# Patient Record
Sex: Male | Born: 1975
Health system: Southern US, Community
[De-identification: ages and names within clinical notes are randomized; demographics above are authoritative.]

---

## 2020-01-13 ENCOUNTER — Other Ambulatory Visit: Payer: Self-pay

## 2020-01-13 ENCOUNTER — Encounter: Payer: Self-pay | Admitting: Family Medicine

## 2020-01-13 ENCOUNTER — Ambulatory Visit (INDEPENDENT_AMBULATORY_CARE_PROVIDER_SITE_OTHER): Payer: Self-pay | Admitting: Family Medicine

## 2020-01-13 ENCOUNTER — Ambulatory Visit: Payer: Self-pay

## 2020-01-13 VITALS — BP 120/80 | HR 110 | Ht 70.0 in | Wt 193.0 lb

## 2020-01-13 DIAGNOSIS — M25512 Pain in left shoulder: Secondary | ICD-10-CM

## 2020-01-13 DIAGNOSIS — G8929 Other chronic pain: Secondary | ICD-10-CM

## 2020-01-13 NOTE — Patient Instructions (Addendum)
Thank you for coming in today. Do the exercises we discussed.  Try voltaren gel up to 4x daily.  We will consider nitorpatches.  Keep me updated.  Recheck as needed.   Look up shoulder impingement.  Long head of biceps tendonitis.  Distal clavicle osteolysis Scapula Stabilization.   Please perform the exercise program that we have prepared for you and gone over in detail on a daily basis.  In addition to the handout you were provided you can access your program through: [email or web address]   Your unique program code is: [email or web address]   Nitroglycerin Protocol   Apply 1/4 nitroglycerin patch to affected area daily.  Change position of patch within the affected area every 24 hours.  You may experience a headache during the first 1-2 weeks of using the patch, these should subside.  If you experience headaches after beginning nitroglycerin patch treatment, you may take your preferred over the counter pain reliever.  Another side effect of the nitroglycerin patch is skin irritation or rash related to patch adhesive.  Please notify our office if you develop more severe headaches or rash, and stop the patch.  Tendon healing with nitroglycerin patch may require 12 to 24 weeks depending on the extent of injury.  Men should not use if taking Viagra, Cialis, or Levitra.   Do not use if you have migraines or rosacea.

## 2020-01-13 NOTE — Progress Notes (Signed)
Subjective:    CC: L shoulder pain  I, Molly Weber, LAT, ATC, am serving as scribe for Dr. Clementeen Graham.  HPI: Pt is a 44 y/o male presenting w/ c/o L shoulder pain x years w/ no specific MOI.  He likes to workout at the gym and do heavy bench and overhead press.  He locates his pain to his anterior shoulder.  He rates his pain as moderate and describes his pain as sharp.  He participates in CrossFit.  He finds exercise and weightlifting very helpful and does not want to stop it if able.  Radiating pain: yes into the L upper arm L shoulder mechanical symptoms: Yes Aggravating factors: reach and turn; Arnold overhead press; L sidelying; functional IR Treatments tried: Theragun; accupuncture in the past; PT  Pertinent review of Systems: No fevers or chills  Relevant historical information: No significant medical history available today.  Patient recently lived in [place].   Objective:    Vitals:   01/13/20 0813  BP: 120/80  Pulse: (!) 110  SpO2: 95%   General: Well Developed, well nourished, and in no acute distress.   MSK: C-spine: Normal-appearing nontender normal cervical motion. Left shoulder normal-appearing without obvious abnormality. Mildly tender palpation AC joint. Nontender anterior shoulder at bicipital groove. Range of motion: Abduction full.  Forward flexion full however some pain beyond 90 degrees. Internal rotation to lumbar spine. Normal external rotation. Strength intact abduction external/internal rotation. Mildly positive empty can test. Mildly positive Hawkins and Neer's test. Negative Yergason's and speeds test. Positive crossover arm compression test. Positive O'Brien test.   Negative click or clunk or relocation test.  Contralateral right shoulder normal-appearing Nontender. Normal motion. Normal strength. Negative impingement biceps and labrum testing.  Pulses cap refill and sensation are intact distally.  Lab and Radiology  Results  Diagnostic Limited MSK Ultrasound of: Left shoulder Biceps tendon is intact however does not in the bicipital groove.  Trace hypoechoic fluid surrounding tendon and tendon sheath. Subscapularis tendon is intact without obvious tear. Supraspinatus tendon intact without obvious tear. Infraspinatus tendon intact without obvious tear. AC joint slightly degenerative appearing with mild effusion. Impression: Evidence of proximal biceps tendon subluxation.  No significant rotator cuff tear visible.    Impression and Recommendations:    Assessment and Plan: 44 y.o. male with left shoulder pain.  Ongoing off and on for months.  Triggering activity usually involves abduction and crossover arm positioning.  I think he probably has a component of pain originating from [facility] joint probably distal clavicle osteolysis.  Additionally he probably is having some shoulder impingement as well. Discussed options.  ATC reviewed home exercise program and scapular stabilization protocol with patient. Additionally discussed strategies for weight lifting while minimizing pain. Also recommend Voltaren gel especially at [facility] joint.  If not improving would recommend starting nitroglycerin patch protocol and considering formal physical therapy referral.  He is briefly without health insurance so physical therapy right now is probably can to be a bit expensive.  Recheck back as needed.  Precautions reviewed.  Patient expresses understanding and agreement.   Orders Placed This Encounter  Procedures  . [place] LIMITED JOINT SPACE STRUCTURES UP LEFT(NO LINKED CHARGES)    Order Specific Question:   Reason for Exam (SYMPTOM  OR DIAGNOSIS REQUIRED)    Answer:   L shoulder pain    Order Specific Question:   Preferred imaging location?    Answer:   [facility]   No orders of the defined  types were placed in this encounter.   Discussed warning signs or symptoms. Please see discharge instructions.  Patient expresses understanding.   The above documentation has been reviewed and is accurate and complete Lynne Leader

## 2020-06-23 DIAGNOSIS — F4320 Adjustment disorder, unspecified: Secondary | ICD-10-CM | POA: Diagnosis not present

## 2020-07-07 DIAGNOSIS — F4320 Adjustment disorder, unspecified: Secondary | ICD-10-CM | POA: Diagnosis not present

## 2021-02-24 NOTE — Progress Notes (Signed)
I, Christoper Fabian, LAT, ATC, am serving as scribe for Dr. Clementeen Graham.  Reginald Hester is a 45 y.o. male who presents to [facility] today for chronic R shoulder pain x 10 years that has been getting progressively worse.  He states that his R shoulder "went out" when he was bowling following doing resisted bench w/ resistance bands.   He notes that his wife had to pull his arm to get his R shoulder back in place later that night after finishing bowling.  Pt was previously seen by Dr. Denyse Amass on 01/13/20 for chronic L shoulder pain. Pt likes to workout/weightlift and participates in CrossFit. Pt locates pain to his R sup/ant shoulder.  He has been going to physical therapy for this issue for 8 weeks.  He has been doing home shoulder stabilization exercises and receiving dry needling with only minimal benefit.  He notes symptoms started about 8 weeks ago when he was bowling and he felt his shoulder effectively dislocate.  He looked it up and his wife helped him reduce his own shoulder dislocation by pulling axially on his arm until the shoulder went into a more normal and comfortable position.  Radiating pain: No  R shoulder Mechanical symptoms: yes UE Numbness/tingling: No Aggravates: unloaded forward reach; pushing I.e. push-ups and bench press Treatments tried: dry needling; Body Blade; relative rest;   Pertinent review of systems: No fevers or chills  Relevant historical information: Otherwise healthy   Exam:  BP (!) 160/88 (BP Location: Right Arm, Patient Position: Sitting, Cuff Size: Normal)   Pulse (!) 104   Ht 5\' 10"  (1.778 m)   Wt 194 lb 9.6 oz (88.3 kg)   SpO2 97%   BMI 27.92 kg/m  General: Well Developed, well nourished, and in no acute distress.   MSK: Right shoulder well-developed musculature normal-appearing otherwise. Normal shoulder motion pain with abduction. Strength 3/5 to abduction 4/5 external rotation 5/5 internal rotation. Positive  Hawkins and Neer's test positive empty can test positive O'Brien's test. Positive clunk and relocation test.  Negative anterior apprehension test.    Lab and Radiology Results  X-ray images right shoulder obtained today personally and independently interpreted Narrowed AC joint.  No acute fractures. Await formal radiology review  Diagnostic Limited MSK Ultrasound of: Right shoulder Biceps tendon intact.  Small amount of hypoechoic fluid tracks within biceps tendon sheath. Subscapularis tendon is then.  Hypoechoic fluid tracks superficial to subscapularis tendon.  Possible partial thickness tear present without significant retraction. Footplate at subscapularis insertion is irregular Supraspinatus tendon is intact. Mild subacromial bursitis present. Infraspinatus tendon is intact. AC joint mild effusion Impression: Concern subscapularis tear    Assessment and Plan: 45 y.o. male with right shoulder pain.  Patient describes a long history of repeated shoulder pain and dysfunction and what sounds like dislocations.  He had a shoulder dislocation event about 10 weeks ago where his wife successfully reduced a shoulder dislocation herself.  He has been managing this with physical therapy for the last 8 weeks with some improvement but not enough improvement.  He has persistent pain and dysfunction and profound weakness on exam today.  When asked he says that he would to have surgery for the shoulder if needed.  Plan to proceed with MRI arthrogram to further evaluate cause of shoulder pain and dysfunction including potential labrum and rotator cuff tear.  Recheck after MRI.   PDMP not reviewed this encounter. Orders Placed This Encounter  Procedures  . 59  LIMITED JOINT SPACE STRUCTURES UP RIGHT(NO LINKED CHARGES)    Order Specific Question:   Reason for Exam (SYMPTOM  OR DIAGNOSIS REQUIRED)    Answer:   R shoulder pain    Order Specific Question:   Preferred imaging location?    Answer:    [facility]  . DG Shoulder Right    Standing Status:   Future    Number of Occurrences:   1    Standing Expiration Date:   02/27/2022    Order Specific Question:   Reason for Exam (SYMPTOM  OR DIAGNOSIS REQUIRED)    Answer:   eval shoulder pain    Order Specific Question:   Preferred imaging location?    Answer:   [facility]  . MR Shoulder Right W Contrast    No iv contrast. Arthrogam only. Dr T 1 hr prior to injection    Standing Status:   Future    Standing Expiration Date:   02/27/2022    Scheduling Instructions:     No iv contrast. Arthrogam only. Dr T 1 hr prior to injection    Order Specific Question:   If indicated for the ordered procedure, I authorize the administration of contrast media per Radiology protocol    Answer:   Yes    Order Specific Question:   What is the patient's sedation requirement?    Answer:   No Sedation    Order Specific Question:   Does the patient have a pacemaker or implanted devices?    Answer:   No    Order Specific Question:   Preferred imaging location?    Answer:   [facility] (table limit-350lbs)   No orders of the defined types were placed in this encounter.    Discussed warning signs or symptoms. Please see discharge instructions. Patient expresses understanding.   The above documentation has been reviewed and is accurate and complete Clementeen Graham, M.D.

## 2021-02-27 ENCOUNTER — Ambulatory Visit (INDEPENDENT_AMBULATORY_CARE_PROVIDER_SITE_OTHER): Payer: BC Managed Care – PPO | Admitting: Family Medicine

## 2021-02-27 ENCOUNTER — Ambulatory Visit (INDEPENDENT_AMBULATORY_CARE_PROVIDER_SITE_OTHER): Payer: BC Managed Care – PPO

## 2021-02-27 ENCOUNTER — Ambulatory Visit: Payer: Self-pay

## 2021-02-27 ENCOUNTER — Encounter: Payer: Self-pay | Admitting: Family Medicine

## 2021-02-27 ENCOUNTER — Other Ambulatory Visit: Payer: Self-pay

## 2021-02-27 VITALS — BP 160/88 | HR 104 | Ht 70.0 in | Wt 194.6 lb

## 2021-02-27 DIAGNOSIS — G8929 Other chronic pain: Secondary | ICD-10-CM

## 2021-02-27 DIAGNOSIS — M25511 Pain in right shoulder: Secondary | ICD-10-CM

## 2021-02-27 NOTE — Patient Instructions (Addendum)
Thank you for coming in today.   You should hear from MRI scheduling within 1 week. If you do not hear please let me know.    SLAP Lesions Rehab Ask your health care provider which exercises are safe for you. Do exercises exactly as told by your health care provider and adjust them as directed. It is normal to feel mild stretching, pulling, tightness, or discomfort as you do these exercises. Stop right away if you feel sudden pain or your pain gets worse. Do not begin these exercises until told by your health care provider. Stretching and range-of-motion exercise This exercise warms up your muscles and joints and improves the movement and flexibility of your shoulder. This exercise also helps to relieve pain and stiffness. Passive shoulder horizontal adduction In passive adduction, you use your other hand to move the injured arm toward your body. The injured arm does not move on its own (passive). In this movement, your arm is moved across your body in the horizontal plane (horizontal adduction). 1. Sit or stand and pull your left / right elbow across your chest, toward your other shoulder. Stop when you feel a gentle stretch in the back of your shoulder and upper arm. ? Keep your arm at shoulder height. ? Keep your arm as close to your body as you comfortably can. 2. Hold for __________ seconds. 3. Slowly return to the starting position. Repeat __________ times. Complete this exercise __________ times a day.   Strengthening exercises These exercises build strength and endurance in your shoulder. Endurance is the ability to use your muscles for a long time, even after they get tired. Scapular protraction, supine 1. Lie on your back on a firm surface (supine position). Hold a __________ lb / kg weight in your left / right hand. 2. Raise your left / right arm straight into the air so your hand is directly above your shoulder joint. 3. Push the weight into the air so your shoulder (scapula) lifts  off the surface that you are lying on. The scapula will push up or forward (protraction). Do not move your head, neck, or back. 4. Hold for __________ seconds. 5. Slowly return to the starting position. Let your muscles relax completely before you repeat this exercise. Repeat __________ times. Complete this exercise __________ times a day.   Scapular retraction 1. Sit in a stable chair without armrests, or stand up. 2. Secure an exercise band to a stable object in front of you so the band is at shoulder height. 3. Hold one end of the exercise band in each hand. Your palms should face down. 4. Squeeze your shoulder blades (scapulae) together and move your elbows slightly behind you (retraction). Do not shrug your shoulders. 5. Hold for __________ seconds. 6. Slowly return to the starting position. Repeat __________ times. Complete this exercise __________ times a day.   Shoulder external rotation 1. Lie down on your uninjured side. Place a soft object, such as a small folded towel, between your left / right arm (your top arm) and your body. 2. Bend your left / right elbow to a 90-degree angle (right angle). Place your left / right hand palm-down on your abdomen. Squeeze your shoulder blade back. 3. Keeping your elbow bent to a 90-degree angle, move your left / right forearm away from your abdomen (external rotation). ? Your upper arm should not move off the folded towel. ? Keep your shoulder blade back. 4. Hold for __________ seconds. 5. Slowly return to the  starting position. Repeat __________ times. Complete this exercise __________ times a day. Shoulder extension, prone 1. Lie on your abdomen (prone position) on a firm surface so your left / right arm hangs over the edge. 2. Hold a __________ lb / kg weight in your hand so your palm faces in toward your body. Your arm should be straight. 3. Squeeze your shoulder blade down toward the middle of your back. 4. Slowly raise your arm behind  you, up to the height of the surface that you are lying on (extension). Keep your arm straight. 5. Hold for __________ seconds. 6. Slowly return to the starting position and relax your muscles. Repeat __________ times. Complete this exercise __________ times a day.   This information is not intended to replace advice given to you by your health care provider. Make sure you discuss any questions you have with your health care provider. Document Revised: 01/06/2019 Document Reviewed: 01/06/2019 Elsevier Patient Education  2021 [employer].

## 2021-02-28 NOTE — Progress Notes (Signed)
Right shoulder x-ray does show some mild arthritis.  MRI will be helpful

## 2021-03-01 ENCOUNTER — Telehealth: Payer: Self-pay | Admitting: Radiology

## 2021-03-01 ENCOUNTER — Other Ambulatory Visit: Payer: Self-pay | Admitting: Family Medicine

## 2021-03-01 DIAGNOSIS — Z1389 Encounter for screening for other disorder: Secondary | ICD-10-CM

## 2021-03-01 MED ORDER — LORAZEPAM 0.5 MG PO TABS: ORAL_TABLET | ORAL | 0 refills | Status: AC

## 2021-03-01 NOTE — Telephone Encounter (Signed)
Ativan prescribed for MRI.

## 2021-03-01 NOTE — Telephone Encounter (Signed)
Spoke w/ pt and let him know the rx was sent into his pharmacy. Pt verbalized understanding.

## 2021-03-06 ENCOUNTER — Ambulatory Visit (INDEPENDENT_AMBULATORY_CARE_PROVIDER_SITE_OTHER): Payer: BC Managed Care – PPO | Admitting: Sports Medicine

## 2021-03-06 ENCOUNTER — Other Ambulatory Visit: Payer: Self-pay

## 2021-03-06 ENCOUNTER — Ambulatory Visit (INDEPENDENT_AMBULATORY_CARE_PROVIDER_SITE_OTHER): Payer: BC Managed Care – PPO

## 2021-03-06 DIAGNOSIS — M19011 Primary osteoarthritis, right shoulder: Secondary | ICD-10-CM | POA: Diagnosis not present

## 2021-03-06 DIAGNOSIS — G8929 Other chronic pain: Secondary | ICD-10-CM | POA: Diagnosis not present

## 2021-03-06 DIAGNOSIS — M25511 Pain in right shoulder: Secondary | ICD-10-CM | POA: Diagnosis not present

## 2021-03-06 DIAGNOSIS — M24411 Recurrent dislocation, right shoulder: Secondary | ICD-10-CM

## 2021-03-06 DIAGNOSIS — Z1389 Encounter for screening for other disorder: Secondary | ICD-10-CM | POA: Diagnosis not present

## 2021-03-06 DIAGNOSIS — Z01818 Encounter for other preprocedural examination: Secondary | ICD-10-CM | POA: Diagnosis not present

## 2021-03-06 DIAGNOSIS — M75121 Complete rotator cuff tear or rupture of right shoulder, not specified as traumatic: Secondary | ICD-10-CM | POA: Diagnosis not present

## 2021-03-06 DIAGNOSIS — S43431A Superior glenoid labrum lesion of right shoulder, initial encounter: Secondary | ICD-10-CM | POA: Diagnosis not present

## 2021-03-06 NOTE — Assessment & Plan Note (Signed)
Celester has had several potential shoulder dislocations, failed therapy for 8 weeks, MR arthrography, further management per primary treating provider.

## 2021-03-06 NOTE — Progress Notes (Signed)
    Procedures performed today:    Procedure: Real-time Ultrasound Guided gadolinium contrast injection of right glenohumeral joint Device: Samsung HS60  Verbal informed consent obtained.  Time-out conducted.  Noted no overlying erythema, induration, or other signs of local infection.  Skin prepped in a sterile fashion.  Local anesthesia: Topical Ethyl chloride.  With sterile technique and under real time ultrasound guidance: Noted normal-appearing joint.  I advanced a 22-gauge spinal needle into the glenohumeral joint from a posterior approach, I injected 1 cc kenalog 40, 2 cc lidocaine, 2 cc bupivacaine, syringe switched and 0.1 cc gadolinium injected, syringe again switched and 10 cc sterile saline used to distend the joint. Joint visualized and capsule seen distending confirming intra-articular placement of contrast material and medication. Completed without difficulty with exception of a mild vagal episode. Advised to call if fevers/chills, erythema, induration, drainage, or persistent bleeding.  Images permanently stored in PACS Impression: Technically successful ultrasound guided gadolinium contrast injection for MR arthrography.  Please see separate MR arthrogram report.  Independent interpretation of notes and tests performed by another provider:   None.  Brief History, Exam, Impression, and Recommendations:    Shoulder dislocation, recurrent, right Olander has had several potential shoulder dislocations, failed therapy for 8 weeks, MR arthrography, further management per primary treating provider.    ___________________________________________ Ihor Austin. Benjamin Stain, M.D., ABFM., CAQSM. Primary Care and Sports Medicine  [facility]  Adjunct Instructor of Family Medicine  [employer]

## 2021-03-08 ENCOUNTER — Telehealth: Payer: Self-pay | Admitting: Family Medicine

## 2021-03-08 DIAGNOSIS — G8929 Other chronic pain: Secondary | ICD-10-CM

## 2021-03-08 DIAGNOSIS — M75111 Incomplete rotator cuff tear or rupture of right shoulder, not specified as traumatic: Secondary | ICD-10-CM

## 2021-03-08 NOTE — Telephone Encounter (Signed)
I called Reginald Hester back.  Recommend that he follow-up with orthopedic surgeon sent to have detailed discussion about surgical options and whether or not it can wait.  I think it probably could wait till September but he should not wait until September to have the surgical consultation.

## 2021-03-08 NOTE — Progress Notes (Signed)
MRI shows rotator cuff tear of the supraspinatus and subscapularis rotator cuff tendons.  Additionally there is some tearing of the labrum.  This will require surgery.  Have already placed referral to orthopedic surgery.  You should hear soon about an appointment.

## 2021-03-08 NOTE — Telephone Encounter (Signed)
Pt aware of MRI results and surgical referral, he has 3 follow up questions:  He has 3 trips planned for this summer, he would like to wait until September for surgery and wonders what Dr. Denyse Amass would recommend.   Should he continue working out?  Can the MRI tell if the tear is new or old?

## 2021-03-08 NOTE — Telephone Encounter (Signed)
Referral to orthopedics placed today

## 2021-03-15 DIAGNOSIS — M25511 Pain in right shoulder: Secondary | ICD-10-CM | POA: Diagnosis not present

## 2021-03-30 ENCOUNTER — Encounter: Payer: Self-pay | Admitting: Orthopedic Surgery

## 2021-03-30 ENCOUNTER — Other Ambulatory Visit: Payer: Self-pay

## 2021-03-30 ENCOUNTER — Ambulatory Visit (INDEPENDENT_AMBULATORY_CARE_PROVIDER_SITE_OTHER): Payer: BC Managed Care – PPO | Admitting: Orthopedic Surgery

## 2021-03-30 DIAGNOSIS — M75121 Complete rotator cuff tear or rupture of right shoulder, not specified as traumatic: Secondary | ICD-10-CM

## 2021-03-30 NOTE — Progress Notes (Signed)
Office Visit Note   Patient: Reginald Hester           Date of Birth: 1976-07-21           MRN: [id number] Visit Date: 03/30/2021 Requested by: Rodolph Bong, MD [address] [place],  [place] [address] PCP: Patient, No Pcp Per (Inactive)  Subjective: Chief Complaint  Patient presents with   Right Shoulder - Pain    HPI: Reginald Hester is a 45 year old patient with right shoulder pain.  States he has had pain in the shoulder for multiple years.  Had first dislocation episode about 10 years ago and 5 since.  His wife is always been able to reduce the shoulder.  He is retired.  He was doing bench press about 2-1/2 to 3 weeks ago and felt an injury but it has not recovered from that injury.  He has been doing some dry needling and has had therapy with Eulis Foster since his MRI scan.  MRI scan shows retracted tear of the supraspinatus to the glenoid and retracted tear of the subscapularis to the glenoid.  Biceps tendon is also out of the groove.  He likes to do weightlifting.  Would like to to a triathlon before age 30 but he has not been swimming since his shoulder has been bothering him.              ROS: All systems reviewed are negative as they relate to the chief complaint within the history of present illness.  Patient denies  fevers or chills.   Assessment & Plan: Visit Diagnoses:  1. Complete tear of right rotator cuff, unspecified whether traumatic     Plan: Impression is unrepairable tear of the subscapularis and likely unrepairable tear of the supraspinatus with retraction to the glenoid rim.  Discussed with Coben that I have had only about 20% success in mobilizing the tendon enough to achieve a repair.  He is surprisingly functional with his right arm indicating that the stairs are likely of long duration mild atrophy of the supraspinatus and subscap.  Discussed with him operative and nonoperative treatment options.  In general instability would be the deciding factor for operative  treatment in the shoulder.  He would require latissimus transfer which would be a rather imperfect solution for his problem.  Nonetheless he is too young to consider any type of shoulder replacement.  He is very functional at this time and I think would be able to manage avoiding overhead lifting and positions of dislocation in the future.  He is going to do that and then we will see him back in 6 months.  Follow-Up Instructions: Return in about 6 months (around 09/30/2021).   Orders:  No orders of the defined types were placed in this encounter.  No orders of the defined types were placed in this encounter.     Procedures: No procedures performed   Clinical Data: No additional findings.  Objective: Vital Signs: There were no vitals taken for this visit.  Physical Exam:   Constitutional: Patient appears well-developed HEENT:  Head: Normocephalic Eyes:EOM are normal Neck: Normal range of motion Cardiovascular: Normal rate Pulmonary/chest: Effort normal Neurologic: Patient is alert Skin: Skin is warm Psychiatric: Patient has normal mood and affect   Ortho Exam: Ortho exam of the right demonstrates passive motion of 75 compared to 45 on the left/100/170.  He has excellent infraspinatus strength as well as no discernible deficit to supraspinatus strength testing.  Subscap is weak on belly press  test compared to the left-hand side.  Patient has very well-developed musculature around the shoulder girdle region.  Specialty Comments:  No specialty comments available.  Imaging: No results found.   PMFS History: Patient Active Problem List   Diagnosis Date Noted   Shoulder dislocation, recurrent, right 03/06/2021   History reviewed. No pertinent past medical history.  History reviewed. No pertinent family history.  History reviewed. No pertinent surgical history. Social History   Occupational History   Not on file  Tobacco Use   Smoking status: Never   Smokeless tobacco:  Never  Substance and Sexual Activity   Alcohol use: Not on file   Drug use: Not on file   Sexual activity: Not on file

## 2021-04-11 ENCOUNTER — Other Ambulatory Visit: Payer: Self-pay | Admitting: Orthopaedic Surgery

## 2021-04-11 DIAGNOSIS — M25511 Pain in right shoulder: Secondary | ICD-10-CM

## 2021-05-01 ENCOUNTER — Ambulatory Visit
Admission: RE | Admit: 2021-05-01 | Discharge: 2021-05-01 | Disposition: A | Discharge status: Home / Self Care | Payer: BC Managed Care – PPO | Source: Ambulatory Visit | Attending: Orthopaedic Surgery | Admitting: Orthopaedic Surgery

## 2021-05-01 ENCOUNTER — Other Ambulatory Visit: Payer: Self-pay

## 2021-05-01 DIAGNOSIS — M75101 Unspecified rotator cuff tear or rupture of right shoulder, not specified as traumatic: Secondary | ICD-10-CM | POA: Diagnosis not present

## 2021-05-01 DIAGNOSIS — M19011 Primary osteoarthritis, right shoulder: Secondary | ICD-10-CM | POA: Diagnosis not present

## 2021-05-01 DIAGNOSIS — M25511 Pain in right shoulder: Secondary | ICD-10-CM

## 2021-05-01 DIAGNOSIS — M62511 Muscle wasting and atrophy, not elsewhere classified, right shoulder: Secondary | ICD-10-CM | POA: Diagnosis not present

## 2021-05-09 DIAGNOSIS — M25511 Pain in right shoulder: Secondary | ICD-10-CM | POA: Diagnosis not present

## 2021-09-04 DIAGNOSIS — N529 Male erectile dysfunction, unspecified: Secondary | ICD-10-CM | POA: Diagnosis not present

## 2021-09-04 DIAGNOSIS — E237 Disorder of pituitary gland, unspecified: Secondary | ICD-10-CM | POA: Diagnosis not present

## 2021-09-04 DIAGNOSIS — E349 Endocrine disorder, unspecified: Secondary | ICD-10-CM | POA: Diagnosis not present

## 2021-09-04 DIAGNOSIS — M791 Myalgia, unspecified site: Secondary | ICD-10-CM | POA: Diagnosis not present

## 2021-09-11 DIAGNOSIS — F419 Anxiety disorder, unspecified: Secondary | ICD-10-CM | POA: Diagnosis not present

## 2021-09-12 DIAGNOSIS — Z79891 Long term (current) use of opiate analgesic: Secondary | ICD-10-CM | POA: Diagnosis not present

## 2021-11-07 DIAGNOSIS — M25511 Pain in right shoulder: Secondary | ICD-10-CM | POA: Diagnosis not present

## 2021-11-13 DIAGNOSIS — M25511 Pain in right shoulder: Secondary | ICD-10-CM | POA: Diagnosis not present

## 2022-02-27 DIAGNOSIS — N529 Male erectile dysfunction, unspecified: Secondary | ICD-10-CM | POA: Diagnosis not present

## 2022-02-27 DIAGNOSIS — E237 Disorder of pituitary gland, unspecified: Secondary | ICD-10-CM | POA: Diagnosis not present

## 2022-02-27 DIAGNOSIS — E349 Endocrine disorder, unspecified: Secondary | ICD-10-CM | POA: Diagnosis not present

## 2022-02-27 DIAGNOSIS — M791 Myalgia, unspecified site: Secondary | ICD-10-CM | POA: Diagnosis not present

## 2023-05-11 IMAGING — MR MR SHOULDER*R* W/CM
6 of 8 series · 26 of 40 positions shown · IV contrast (agent unspecified)
Comparison: None.

CLINICAL DATA: Shoulder pain, labral tear suspected, nondiagnostic
xray

EXAM:
MR ARTHROGRAM OF THE right SHOULDER
TECHNIQUE: Multiplanar, multisequence MR imaging of the right shoulder was
performed following the administration of intra-articular contrast.
CONTRAST:  See Injection Documentation.

[Series 3: T1 fat-sat · axial · 4.0mm · 0.27mm/px · z∈[-31,+70]mm · 5 of 22 slices shown (1 of 3)]
[im 1/22]
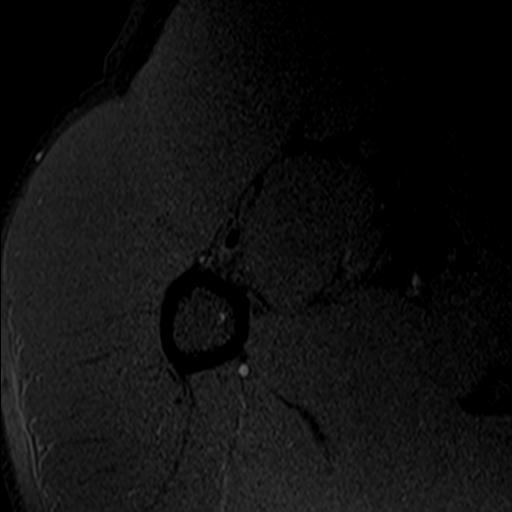
[im 6/22]
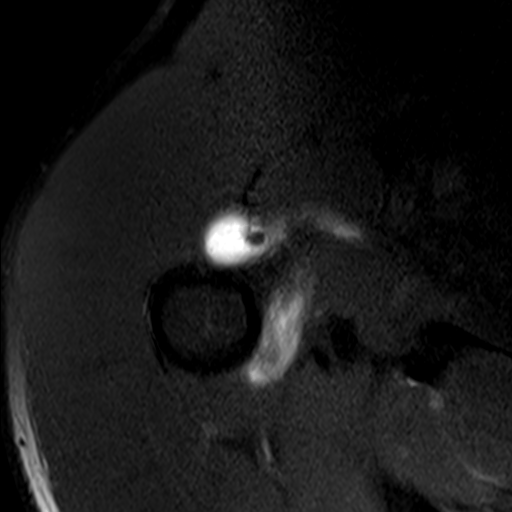
[im 11/22]
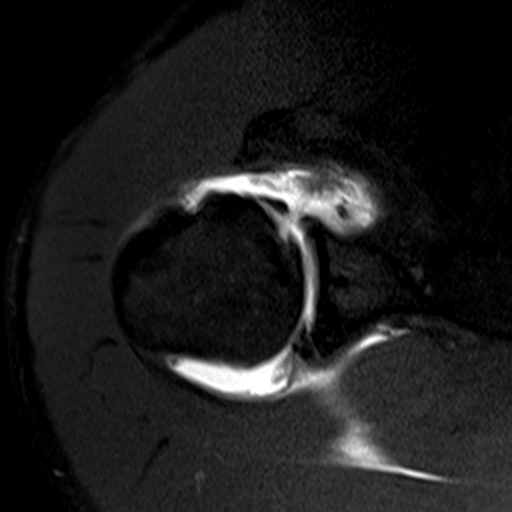
[im 16/22]
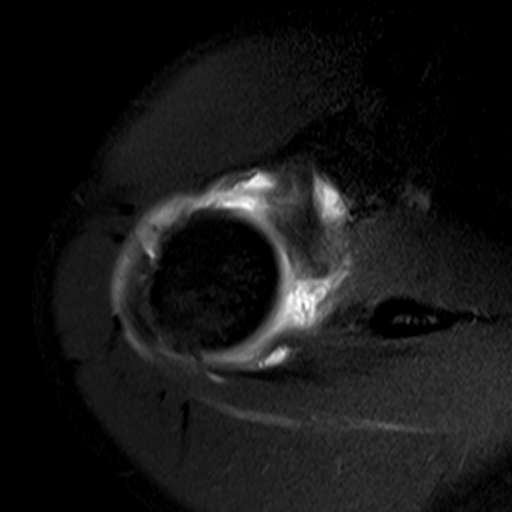
[im 22/22]
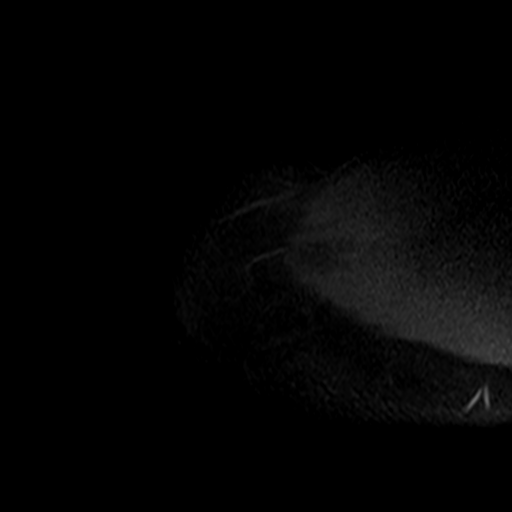

[Series 4: T1 fat-sat · oblique · 4.0mm · 0.55mm/px · 5 of 20 slices shown (2 of 3)]
[im 1/20]
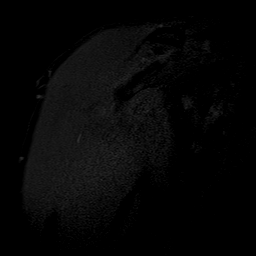
[im 5/20]
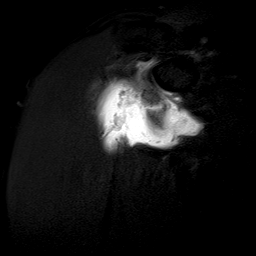
[im 10/20]
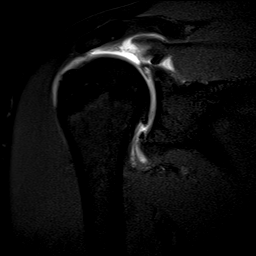
[im 15/20]
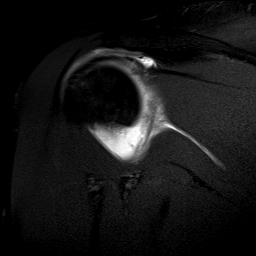
[im 20/20]
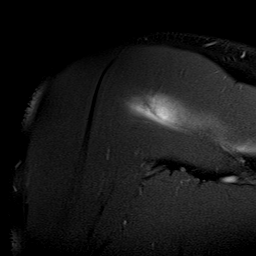

[Series 5: T2 fat-sat · oblique · 4.0mm · 0.27mm/px · 5 of 20 slices shown (1 of 3)]
[im 1/20]
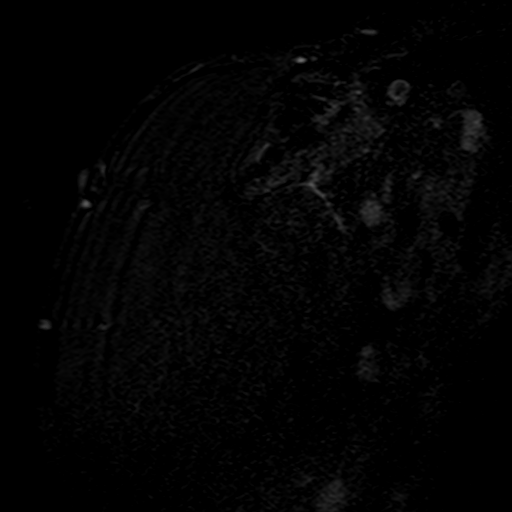
[im 5/20]
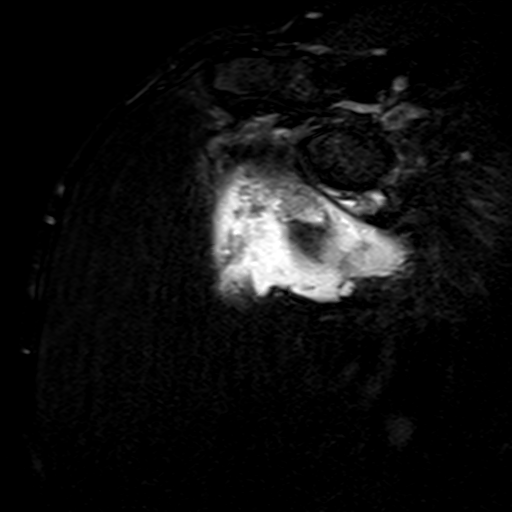
[im 10/20]
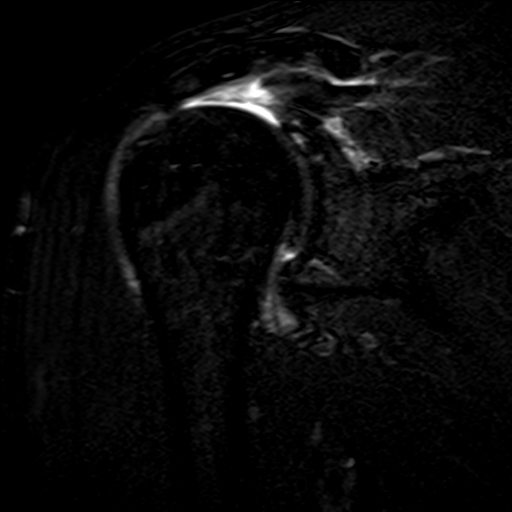
[im 15/20]
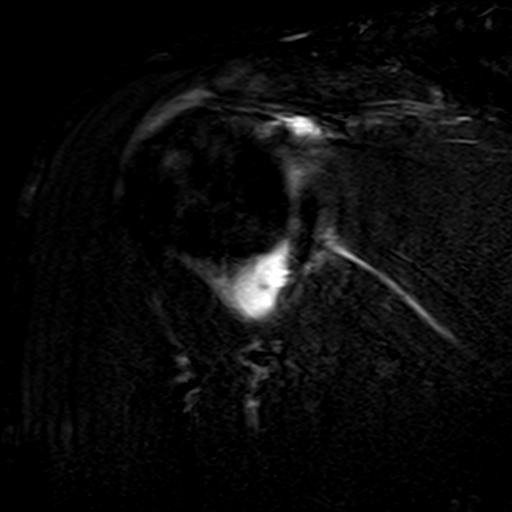
[im 20/20]
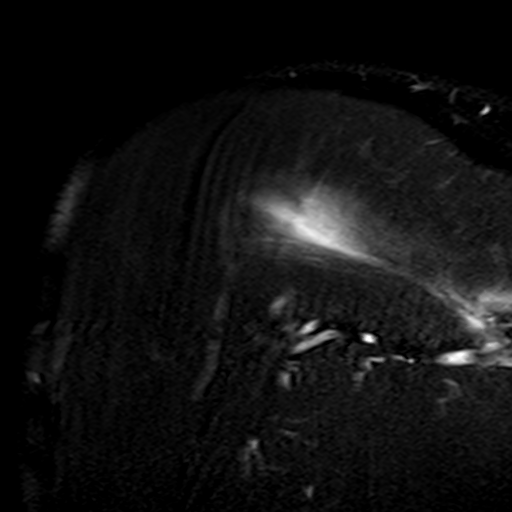

[Series 6: T1 fat-sat · oblique · non-contrast · 4.0mm · 0.27mm/px · 1 of 20 slices shown (3 of 3)]
[im 1/20]
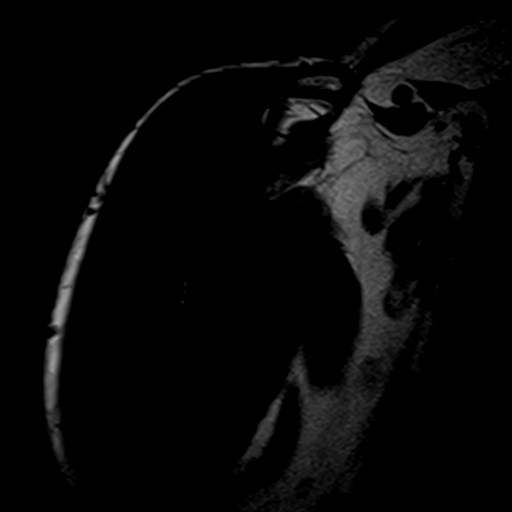

[Series 7: T2 fat-sat · oblique · 4.0mm · 0.55mm/px · 5 of 20 slices shown (2 of 3)]
[im 1/20]
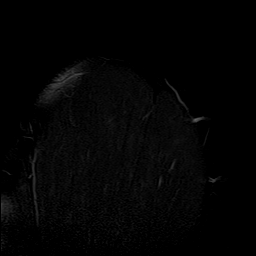
[im 5/20]
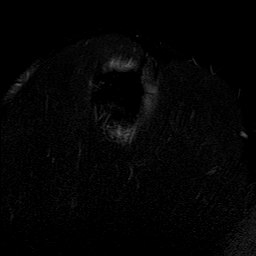
[im 10/20]
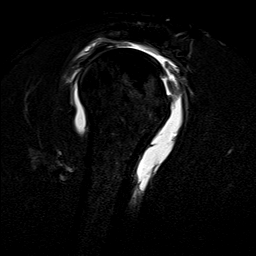
[im 15/20]
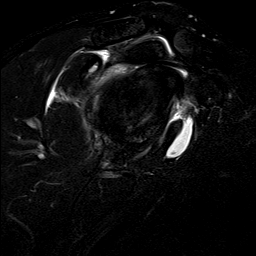
[im 20/20]
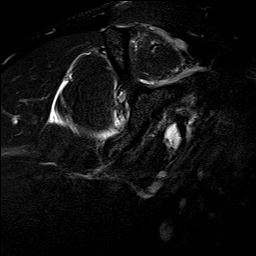

[Series 9: T2 fat-sat · oblique · 4.0mm · 0.27mm/px · 5 of 20 slices shown (3 of 3)]
[im 1/20]
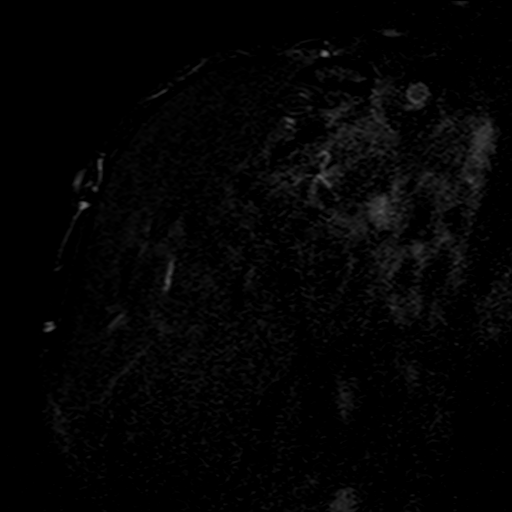
[im 5/20]
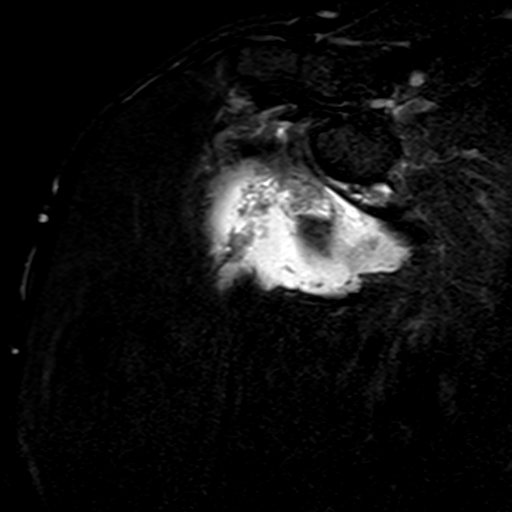
[im 10/20]
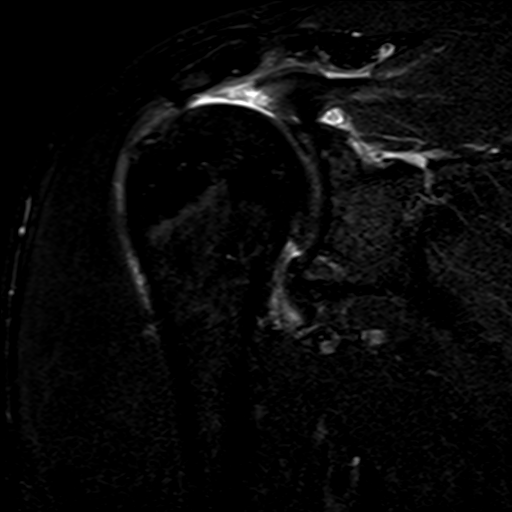
[im 15/20]
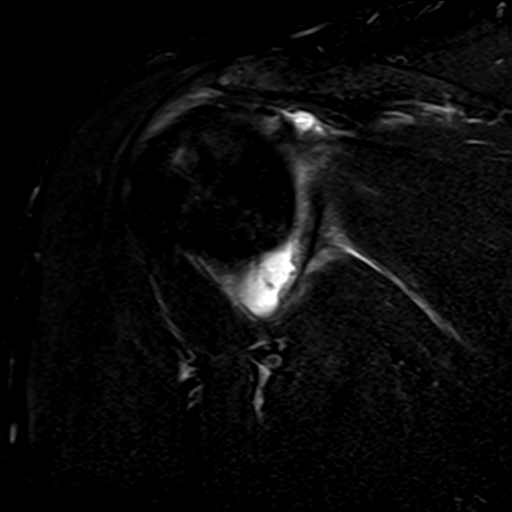
[im 20/20]
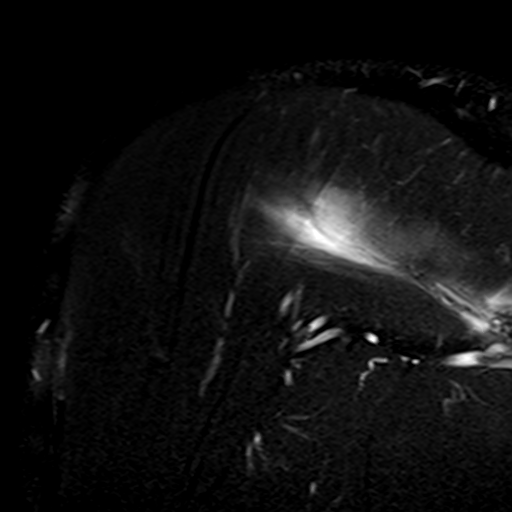

[26 of 40 positions shown; findings below may reference images not displayed]

FINDINGS: Rotator cuff: There is a full-thickness, full width tear of the
supraspinatus tendon with retraction to the glenoid. Distal
infraspinatus tendinosis with articular and bursal sided fraying.
Teres minor is intact. There is a full-thickness subscapularis tear
of the cephalad fibers with retraction to the glenoid, with a few
intact inferior fibers.

Muscles: Mild supraspinatus and moderate subscapularis atrophy.

Biceps Long Head: Medially dislocated long head biceps tendon

Acromioclavicular Joint: Mild arthropathy of the acromioclavicular
joint. Small amount of subacromial/subdeltoid bursal fluid.

Glenohumeral Joint: Mildly distended due to cuff tear. Mild
chondrosis.

Labrum: Diffuse labral fraying, with most evident tearing of the
anterior superior and posteroinferior labrum.

Bones: Possible small non engaging Hill-Sachs lesion.

Other: No fluid collection or hematoma.
IMPRESSION: Full-thickness, full width tear of the supraspinatus tendon with
retraction to the glenoid. Distal infraspinatus tendinosis with
articular and bursal sided fraying.

Full-thickness subscapularis tear of the cephalad fibers with
retraction to the glenoid, with a few intact inferior fibers.

Medially dislocated long head biceps tendon.

Diffuse labral fraying with most evident tearing involving the
anterior superior and posteroinferior labrum.

Possible small non engaging Hill-Sachs lesion. Mild glenohumeral and
AC joint arthritis.
